# Patient Record
Sex: Female | Born: 2005
Health system: Southern US, Community
[De-identification: ages and names within clinical notes are randomized; demographics above are authoritative.]

---
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
## 2023-01-23 DIAGNOSIS — F419 Anxiety disorder, unspecified: Secondary | ICD-10-CM | POA: Diagnosis not present

## 2023-05-07 DIAGNOSIS — Z3009 Encounter for other general counseling and advice on contraception: Secondary | ICD-10-CM | POA: Diagnosis not present

## 2023-05-16 IMAGING — CT CT NECK W/ CM
3 of 5 series · 11 of 33 positions shown, 13 images · IV contrast (agent unspecified)
Comparison: None available.

CLINICAL DATA: Initial evaluation for acute tonsillitis or
epiglottitis.

EXAM:
CT NECK WITH CONTRAST
TECHNIQUE: Multidetector CT imaging of the neck was performed using the
standard protocol following the bolus administration of intravenous
contrast.

[Series 4: sagittal · sagittal · 0.39mm/px · 5 of 69 slices shown, 6 images]
[im 23/69  bone]
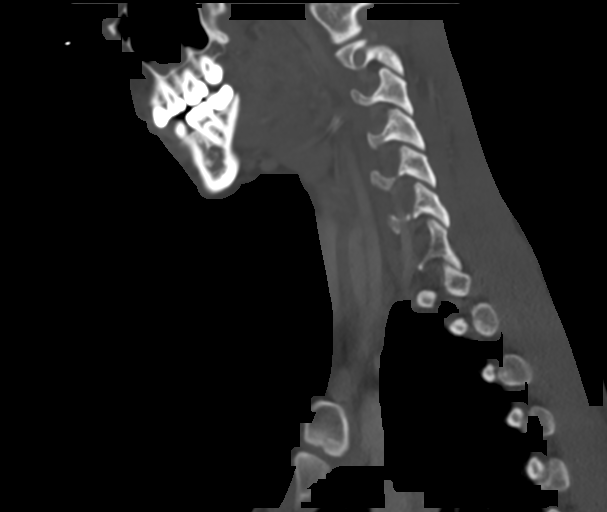
[im 29/69  bone]
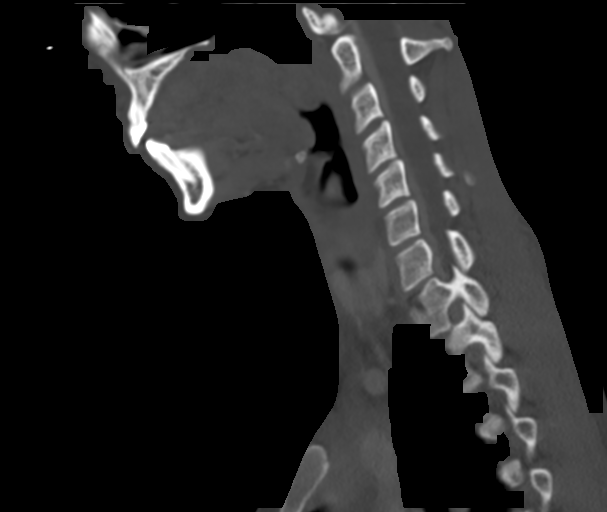
[im 35/69  soft-tissue]
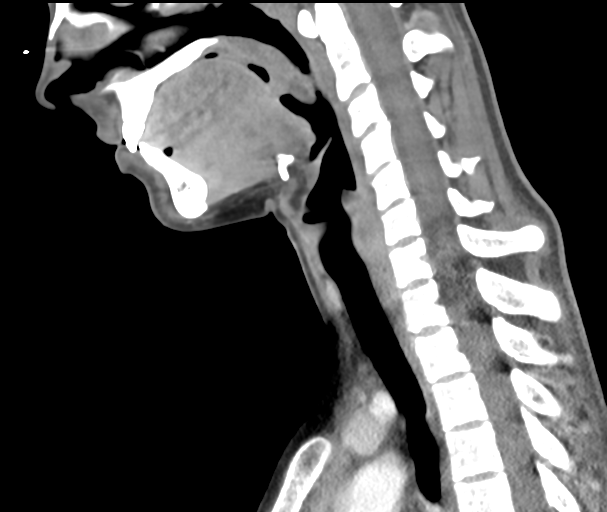
[im 35/69  bone]
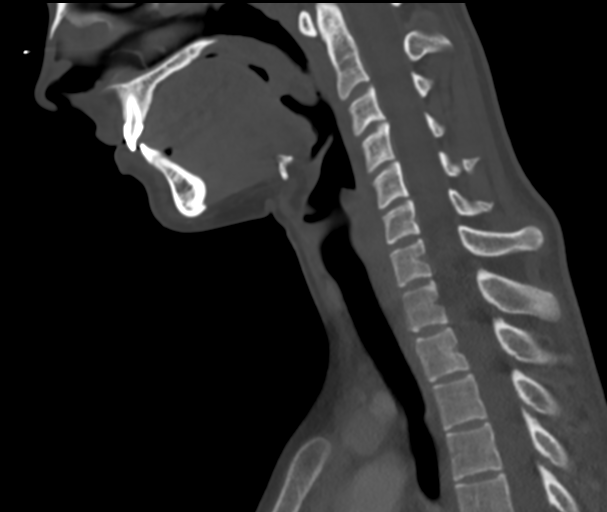
[im 40/69  bone]
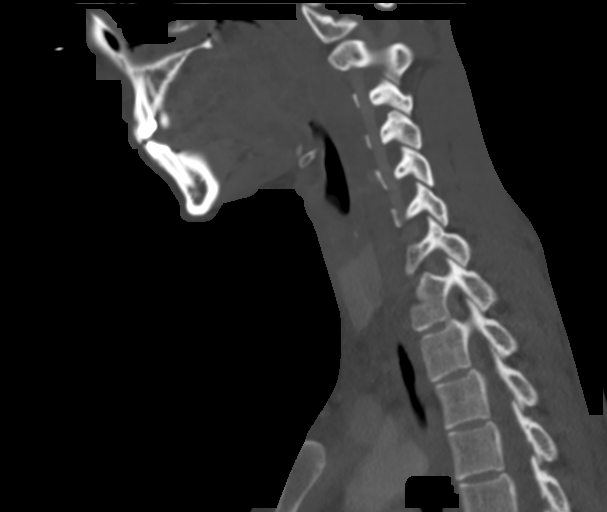
[im 46/69  bone]
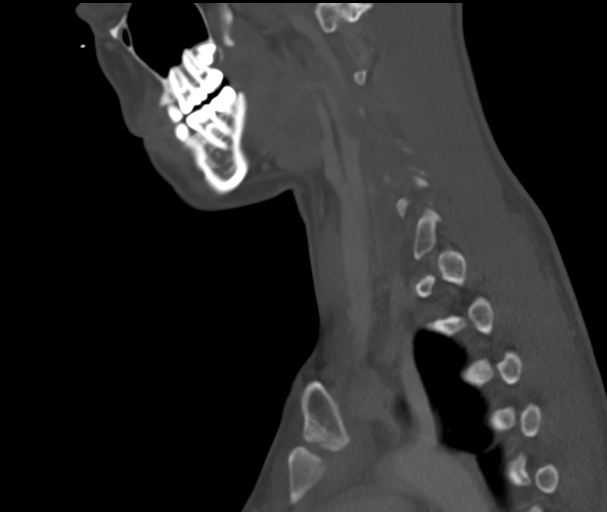

[Series 5: coronal · coronal · 0.27mm/px · 3 of 118 slices shown]
[im 24/118  bone]
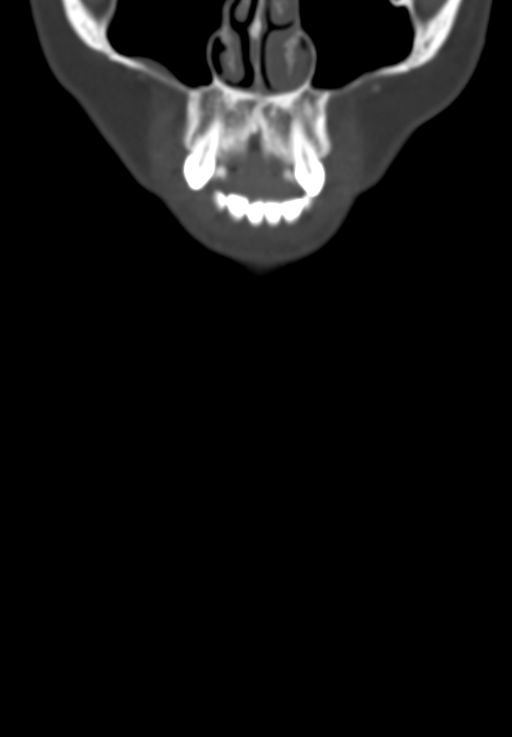
[im 47/118  bone]
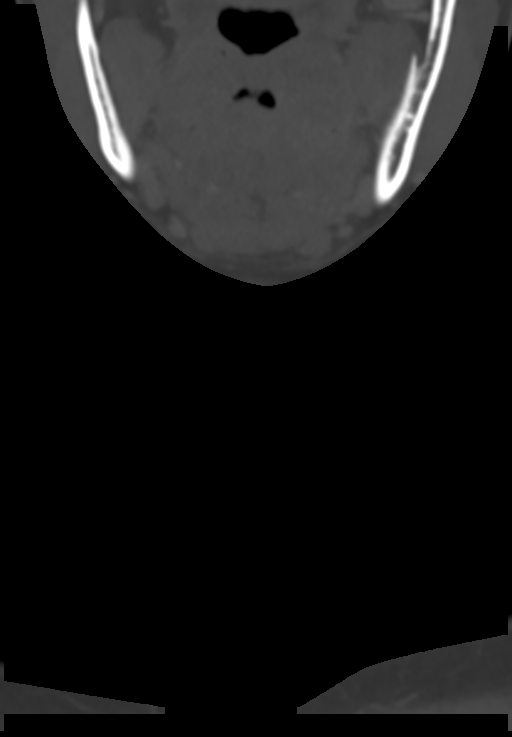
[im 71/118  bone]
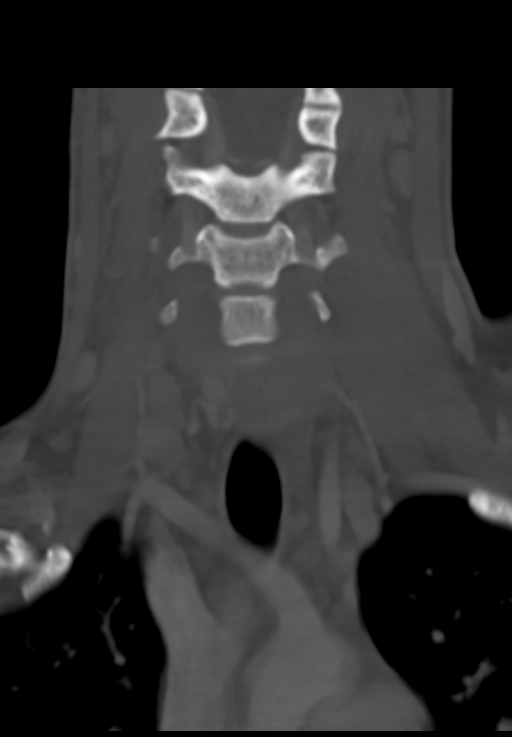

[Series 6: orthogonal · axial · 0.26mm/px · z∈[-207,-77]mm · 3 of 112 slices shown, 4 images]
[im 23/112  soft-tissue]
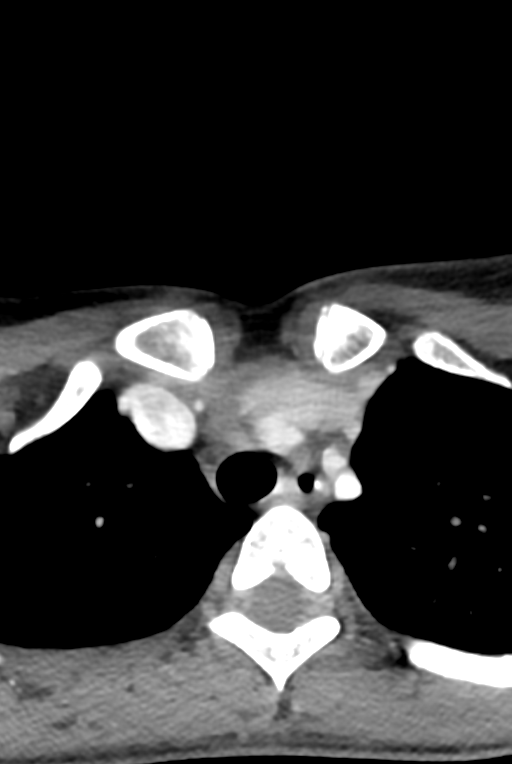
[im 23/112  bone]
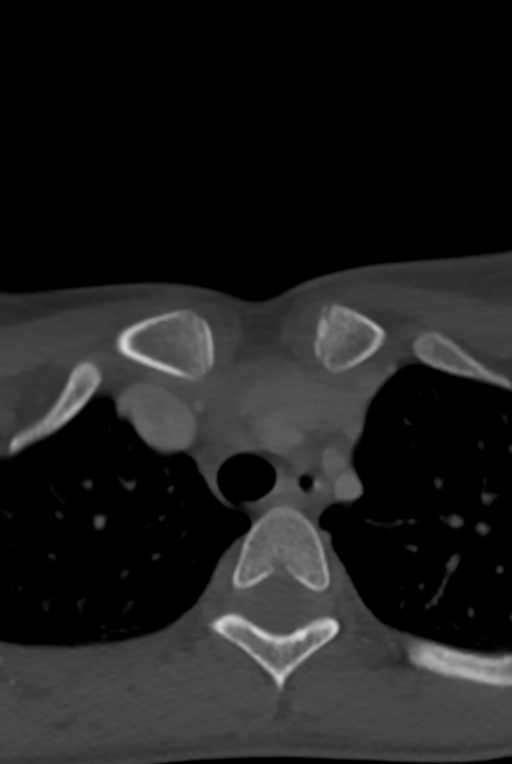
[im 67/112  bone]
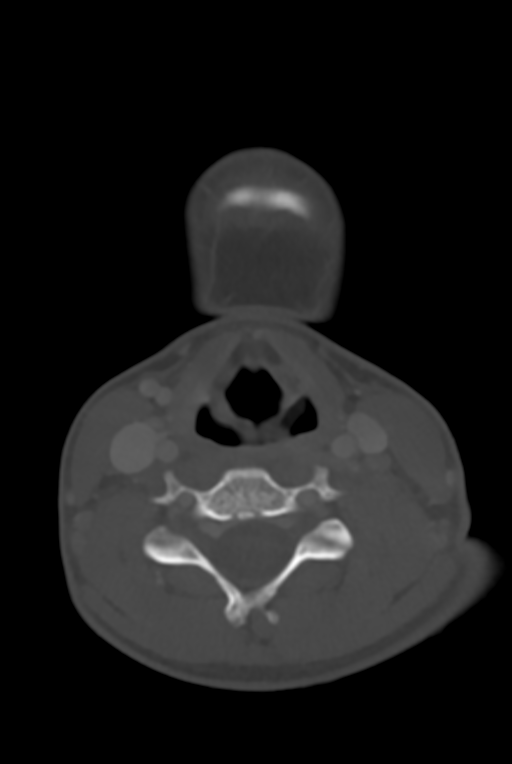
[im 89/112  bone]
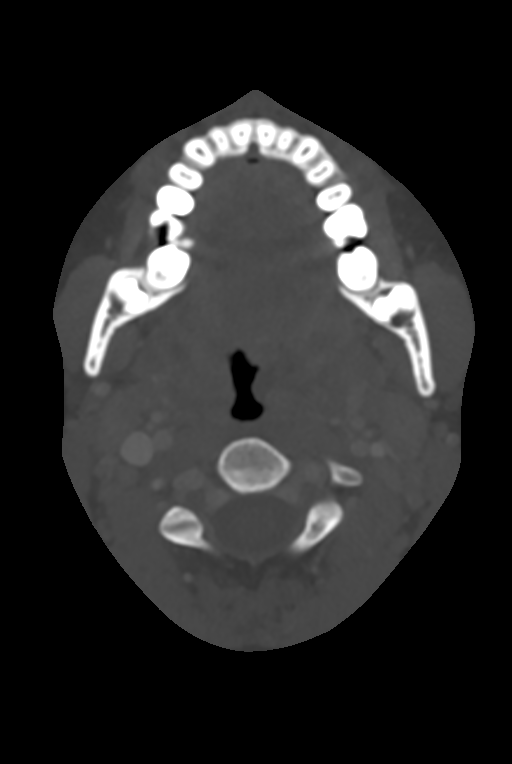

[11 of 33 positions shown; findings below may reference images not displayed]

RADIATION DOSE REDUCTION: This exam was performed according to the
departmental dose-optimization program which includes automated
exposure control, adjustment of the mA and/or kV according to
patient size and/or use of iterative reconstruction technique.

CONTRAST:  75mL OMNIPAQUE IOHEXOL 300 MG/ML  SOLN
FINDINGS: Pharynx and larynx: Oral cavity within normal limits. No acute
inflammatory changes seen about the dentition. Palatine tonsils are
enlarged with heterogeneous enhancement bilaterally, consistent with
acute tonsillitis. Changes slightly more pronounced on the left.
Irregular ill-defined hypodensity within the enlarged tonsils
consistent with edema and/or phlegmonous change. A slightly more
organized hypodense area measuring 1.9 x 1.5 x 2.3 cm at the left
tonsil concerning for early tonsillar/peritonsillar abscess (series
3, image 10). Partially visualized adenoidal soft tissues
hypertrophy as well. Associated mild mucosal edema within the left
oropharyngeal mucosa. No retropharyngeal collection. Epiglottis
itself within normal limits without evidence for acute
epiglottitis/supraglottitis. Vallecula clear. Remainder of the
hypopharynx and supraglottic larynx within normal limits. Glottis
normal. Subglottic airway clear.

Salivary glands: Visualized parotid glands within normal limits.
Submandibular and sublingual glands normal.

Thyroid: Normal.

Lymph nodes: Enlarged bilateral level II lymph nodes measure up to
1.4 cm on the left, presumably reactive. No suppuration or intra
nodal abscess formation.

Vascular: Normal intravascular enhancement seen throughout the neck.

Limited intracranial: Not included on this exam.

Visualized orbits: Unremarkable.

Mastoids and visualized paranasal sinuses: Right maxillary sinus
retention cyst noted. Visualized paranasal sinuses are otherwise
clear. Mastoids not included on this exam.

Skeleton: Unremarkable.

Upper chest: Visualized upper chest demonstrates no acute finding.

Other: None.
IMPRESSION: 1. Findings consistent with acute tonsillitis. Superimposed 1.9 x
1.5 x 2.3 cm heterogeneous hypodense area at the left tonsil
concerning for phlegmon and possible early/developing
tonsillar/peritonsillar abscess. Unclear whether this reflects a
drainable fluid collection at this time.
2. Enlarged bilateral level II lymph nodes, presumably reactive. No
suppuration or intra nodal abscess formation.

## 2023-09-15 DIAGNOSIS — Z309 Encounter for contraceptive management, unspecified: Secondary | ICD-10-CM | POA: Diagnosis not present

## 2024-05-17 DIAGNOSIS — Z118 Encounter for screening for other infectious and parasitic diseases: Secondary | ICD-10-CM | POA: Diagnosis not present

## 2024-05-17 DIAGNOSIS — Z3009 Encounter for other general counseling and advice on contraception: Secondary | ICD-10-CM | POA: Diagnosis not present

## 2024-05-17 DIAGNOSIS — N921 Excessive and frequent menstruation with irregular cycle: Secondary | ICD-10-CM | POA: Diagnosis not present
